# Patient Record
Sex: Male | Born: 1940 | Race: Black or African American | Hispanic: No | Marital: Married
Health system: Southern US, Community
[De-identification: ages and names within clinical notes are randomized; demographics above are authoritative.]

---

## 2004-04-14 ENCOUNTER — Ambulatory Visit (HOSPITAL_COMMUNITY): Admission: RE | Admit: 2004-04-14 | Discharge: 2004-04-14 | Payer: Self-pay | Admitting: Neurosurgery

## 2004-05-19 ENCOUNTER — Inpatient Hospital Stay (HOSPITAL_COMMUNITY): Admission: RE | Admit: 2004-05-19 | Discharge: 2004-05-24 | Payer: Self-pay | Admitting: Neurosurgery

## 2004-05-24 ENCOUNTER — Inpatient Hospital Stay (HOSPITAL_COMMUNITY)
Admission: RE | Admit: 2004-05-24 | Discharge: 2004-05-28 | Payer: Self-pay | Admitting: Physical Medicine & Rehabilitation

## 2004-12-15 IMAGING — CR IR MYELOGRAM [PERSON_NAME]
2 series · 2 of 2 positions shown · non-contrast
Comparison: none

CLINICAL DATA: eft leg pain; three prior surgeries
CERVICAL MYELOGRAM WITH POSTMYELOGRAM CT CERVICAL SPINE
Contrast was negotiated into the cervical spine without any difficulty.  Impression upon the lateral aspect (right greater than left) of the thecal sac at the C4-5 level near the exiting C5 nerve roots.  Please see below.  On the crosstable lateral view, there is evidence of prior compression deformity of the C7 vertebra with mild retropulsion.  
IMPRESSION
Encroachment upon the C5 nerve roots right greater than left.  lease see below.
Prior compression deformity C7 vertebra with mild retropulsion.  
POSTMYELOGRAM CT CERVICAL SPINE 
Axial images were obtained from the occiput to the T2-3 disc space level.  Sagittal and coronal reconstructed images were obtained.

[view not recorded (1 of 2)]
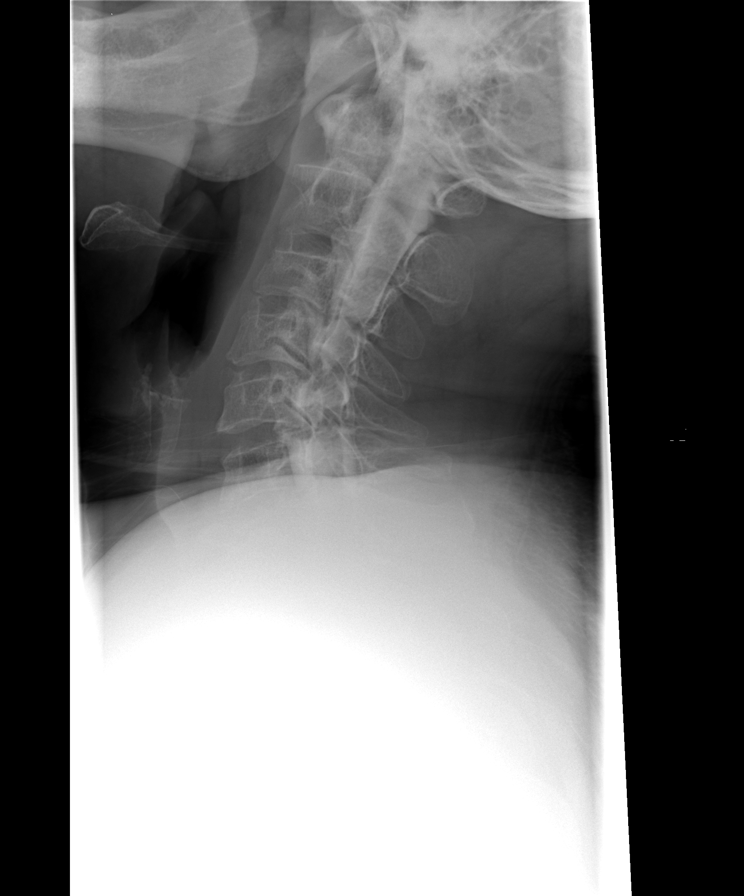

[view not recorded (2 of 2)]
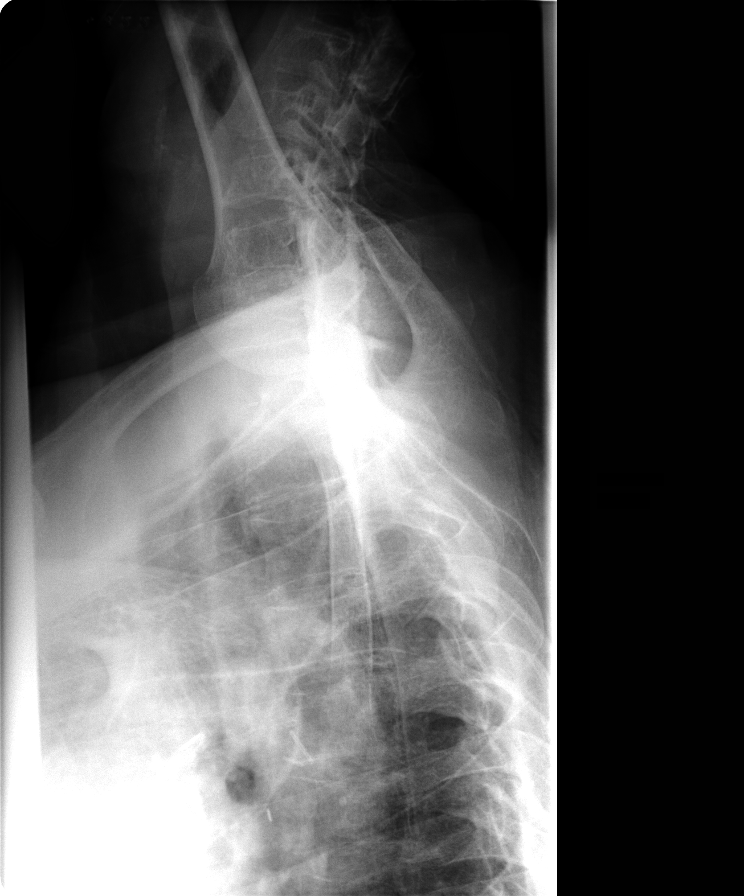

[2 of 2 positions shown; findings below may reference images not displayed]

FINDINGS: ervicomedullary junction is unremarkable.  C1-2 articulation within normal limits.  
C2-3:  Minimal right posterolateral bony spur.  
C3-4:  Small broad-based disc protrusion/spur having greatest extension right posterolateral position with slight impression upon the right ventral aspect of the thecal sac.
C4-5:  Moderate bulge/small broad-based protrusion minimally more notable towards right.  Moderate right-sided and mild-moderate left-sided spinal stenosis.  Uncovertebral joint degenerative changes with moderate right-sided and mild left-sided neural foraminal narrowing.  
C5-6:  Moderate bulge/small broad-based protrusion minimally more notable towards the left.  Mild spinal stenosis (left greater than right).  
C6-7:  Prior fracture of the C7 vertebra.  The C6-7 vertebra now appear fused.  There is retropulsion of the posterior aspect of the compressed C7 vertebra causing mild spinal stenosis but without significant cord deformity.
C7-T1:  Small broad-based central disc protrusion.
T1-2 and T2-3 with minimal bulge.
IMPRESSION
Prior compression deformity of the C7 vertebra with retropulsion with mild spinal stenosis but without cord deformity.    
C3-4   bony spur and mass effect right greater than left.  
C4-5 foraminal narrowing right greater than left.  Spinal stenosis slightly greater on the right.
C5-6 minimal disc disease slightly more notable on the left.  
CT CERVICAL SPINE MULTIPLANAR RECONSTRUCTIONS
Multiplanar reformatted CT images were reconstructed from the axial CT data set. These images were reviewed and pertinent findings are included in the accompanying complete CT report. 

IMPRESSION
See complete CT report.
LUMBAR MYELOGRAM FOLLOWED BY POSTMYELOGRAM CT
FINDINGS: Installation of contrast was performed by Dr. Luiyi at the L3-4 level.  Lumbar myelogram was performed without any difficulty.  Minimal impression upon the ventral aspect of  thecal sac L1-2 through L4-5.  L5-S1 disc is degenerated with disc space narrowing and osteophyte formation.  No specific nerve root compression.  Please see CT report.  Images of the conus reveal a prominent vessel.  If there are any conus symptoms, correlation with MR scan with attention to such recommended.  This was discussed with Dr. Luiyi.  
IMPRESSION
Significant degenerative changes L5-S1.  No abnormal motion between flexion and extension.  No specific nerve root compression.  Please see CT report.  
POSTMYELOGRAM CT 
Images were obtained from the mid-QHH through the upper S1 level.  Conus L2-3 level.  Incidentally noted are atherosclerotic type changes of the abdominal aorta and common iliac arteries which appear slightly ectatic but incompletely evaluated on present examination.  Also detected are right greater than left renal calculi.  
T12-L1:  Negative.  
L1-2:  Negative.  
L2-3:  Negative.
L3-4:  Mild facet joint degenerative changes.  Mild bulge.  
L4-5:  Moderate bilateral facet joint degenerative changes.  Mild to moderate bulge.  Bilateral foraminal narrowing without nerve root compression.  Mild to moderate spinal stenosis.  
L5-S1:  Prior left-sided hemilaminectomy.  Marked disc degeneration, disc space narrowing and adjacent endplate degenerative changes with broad-based osteophytes.  Bilateral facet joint degenerative disease and bony overgrowth.  Multifactorial moderate bilateral foraminal narrowing with encroachment upon the course of the exiting L5 nerve roots.  
IMPRESSION 
L5-S1 moderate bilateral foraminal narrowing with encroachment upon the course of the exiting L5 nerve roots.  
L4-5 multifactorial mild to moderate spinal stenosis and mild bilateral foraminal narrowing.
Atherosclerotic type changes of abdominal aorta and common iliac arteries as described above.
Bilateral renal calculi right greater than left.  
CT LUMBAR SPINE MULTIPLANAR RECONSTRUCTIONS
Multiplanar reformatted CT images were reconstructed from the axial CT data set. These images were reviewed and pertinent findings are included in the accompanying complete CT report. 

IMPRESSION
See complete CT report.

## 2008-03-17 ENCOUNTER — Ambulatory Visit (HOSPITAL_COMMUNITY): Admission: RE | Admit: 2008-03-17 | Discharge: 2008-03-17 | Payer: Self-pay | Admitting: Urology

## 2008-05-26 ENCOUNTER — Ambulatory Visit (HOSPITAL_COMMUNITY): Admission: RE | Admit: 2008-05-26 | Discharge: 2008-05-26 | Payer: Self-pay | Admitting: Urology

## 2011-04-29 NOTE — H&P (Signed)
NAME:  GIL, INGWERSEN NO.:  [id number]   MEDICAL RECORD NO.:  [id number]                   PATIENT TYPE:  INP   LOCATION:  3031                                 FACILITY:  [facility]   PHYSICIAN:  Hilda Lias, M.D.                DATE OF BIRTH:  1941-04-07   DATE OF ADMISSION:  05/19/2004  DATE OF DISCHARGE:                                HISTORY & PHYSICAL   Mr. Ripberger is a gentleman who was seen in my office on several occasions  because of a history of neck pain.  The patient had been complaining of pain  going from his neck down to his shoulder, going to the right arm, associated  with a numbness sensation.  Also he has been complaining of pain in the  right leg.  The patient tells me the pain in her lower extremities worsened  __________  .  Also he tells me that the numbness in both hands goes away  after he shakes both hands.  In the past, this gentleman had three lumbar  diskectomies because of radiculopathy.  Now, he tells me his neck is feeling  much better but the back pain with radiation to both legs is driving him  crazy.  Because of that, he wants to proceed with surgery.  In the past, he  has a fracture of C7.   PAST MEDICAL HISTORY:  1. Lumbar laminectomy and diskectomy three times in [place].  2. Fracture of cervical 7.   ALLERGIES:  TORADOL.   SOCIAL HISTORY:  The patient does not drink, does not smoke.   FAMILY HISTORY:  Unremarkable.   REVIEW OF SYSTEMS:  Positive for neck pain, numbness in both hands, back  pain with radiation down to both legs.   PHYSICAL EXAMINATION:  HEENT:  Normal.  NECK:  He is able to __________  some discomfort.  LUNGS:  Clear.  HEART:  Sounds normal.  ABDOMEN:  Normal.  EXTREMITIES:  Normal pulses.  NEURO:  Mental status:  Normal.  Cranial nerves:  Normal.  Strength:  Is  5/5.  He has some weakness on dorsiflexion and plantar flexion of the right  leg.  He has had difficulty with flexibility  of the lumbar spine.  His  sensory is normal, though he complains of numbness in right foot and both  hands.   The cervical MRI shows some degenerative disk disease at the level of C4-C5  and C5-C6.  He has a fracture at the level of C6-C7.  In the lumbar spine,  he has a severe case of degenerative disk disease of the L5-S1 with facet  arthropathy, foraminal stenosis.  He had a borderline stenosis at the level  of L4-L5.   CLINICAL IMPRESSION:  1. Status post three lumbar diskectomies laminectomy.  2. Fracture of C7.  3. Cervical spondylosis.  4. Severe case of degenerative disk disease at the level of L5-S1 with facet  arthropathy and borderline stenosis between L4-L5.   RECOMMENDATIONS:  The patient is being admitted for a L5-S1 diskectomy,  interbody fusion with pedicle screws.  He knows about the risks such as  infection, CSF leak, worsening pain, paralysis, need for further surgery,  collapse of the bone graft, damage to the vessels of the abdomen.  He knows  this surgery is not going to correct the problem with the cervical spine.  He declined more opinion.                                                Hilda Lias, M.D.    EB/MEDQ  D:  05/19/2004  T:  05/20/2004  Job:  [id number]

## 2011-04-29 NOTE — Consult Note (Signed)
NAME:  Samuel Hardin, Samuel Hardin NO.:  [id number]   MEDICAL RECORD NO.:  [id number]          PATIENT TYPE:  OUT   LOCATION:  RDC                           FACILITY:  [facility]   PHYSICIAN:  Ky Barban, M.D.DATE OF BIRTH:  1941-10-06   DATE OF CONSULTATION:  DATE OF DISCHARGE:  03/17/2008                                 CONSULTATION   CHIEF COMPLAINT:  Right ureteral calculus.   HISTORY:  A 70 year old gentleman who was seen by me with history of  right renal calculus on March 17, 2008.  CT scan shows there is a stone,  about 5 mm, in the right mid ureter causing some hydroureteronephrosis.  A KUB confirmed the stone, so I advised him to undergo ESL, for which he  is coming as outpatient.  The patient is familiar with the procedure  because he had it done in the past.   PAST MEDICAL HISTORY:  1. History of lithotripsies on both sides.  2. Surgery on his lower back,  a couple of years ago.   PERSONAL HISTORY:  Does not smoke or drink.   REVIEW OF SYSTEMS:  Denies chest pain, orthopnea, PND, nausea, or  vomiting.   PHYSICAL EXAMINATION:  GENERAL:  Well nourished and well developed, not  in acute distress.  VITAL SIGNS:  Blood pressure 124/75 and temperature 97.7.  CENTRAL NERVOUS SYSTEM:  Negative.  HEAD, NECK, EYES, AND ENT:  Negative.  ABDOMEN:  Soft and flat.  Liver, spleen, and kidney are not palpable.  EXTREMITIES:  No CVA tenderness.  GENITALIA:  External genitalia, circumcised meatus adequate.  Testicles  are normal.  RECTAL:  Prostate 1-1/2+, smooth and firm.   ASSESSMENT:  1. Bilateral renal small calculi.  2. Right ureteral calculus.   PLAN:  Right ureteral ESL.     Ky Barban, M.D.  Electronically Signed    MIJ/MEDQ  D:  03/31/2008  T:  04/01/2008  Job:  [id number]

## 2011-04-29 NOTE — Discharge Summary (Signed)
NAME:  Samuel Hardin, Samuel Hardin NO.:  [id number]   MEDICAL RECORD NO.:  [id number]                   PATIENT TYPE:  IPS   LOCATION:  4146                                 FACILITY:  [facility]   PHYSICIAN:  Ellwood Dense, M.D.                DATE OF BIRTH:  03/02/1941   DATE OF ADMISSION:  05/24/2004  DATE OF DISCHARGE:  05/28/2004                                 DISCHARGE SUMMARY   DISCHARGE DIAGNOSES:  1. L5-S1 diskectomy with decompression secondary to degenerative disk     disease of L5-S1.  2. Chronic radiculopathy.  3. Status post hypokalemia.  4. Muscle spasms and neuropathy.  5. Cervical spondylosis.   HISTORY OF PRESENT ILLNESS:  The patient is a 70 year old black male with  past history of a lumbar laminectomy and diskectomy x3 with progressive neck  pain radiating to the right shoulder, right arm, right leg, numbness and  back pain.  The patient underwent an L5-S1 diskectomy with decompression of  L5-S1 nerve root on May 19, 2004 by Dr. Tobey Grim secondary to DDD at L5-S1  and chronic radiculopathy.  The patient also was noted to have cervical  spondylosis.  No DVT prophylaxis at that time.  The patient was ambulating 6  feet with PT for safety on a walker, minimal assist and transfer total  assist +2.  Gait mobility total assist.  Postoperative complications - see  above, pain intolerance and spasms.  The patient was transferred to [facility] re[facility] department on May 24, 2004.   PAST MEDICAL HISTORY:  As above, plus surgical fractures.  Kidney stones.   PAST SURGICAL HISTORY:  As above.   MEDICATIONS PRIOR TO ADMISSION:  None.   SOCIAL HISTORY:  The patient lives with his wife in a 1-level home in  [place].  No tobacco or alcohol use.  He has 2 children.  Good family support.  His wife works for day care and  grandson is available to assist after discharge.   ALLERGIES:  ___________   FAMILY HISTORY:  Noncontributory.   HOSPITAL COURSE:   Mr. Samuel Hardin was admitted to [facility]  Department on May 24, 2004.  This patient received more than 3 hours of therapy daily.  Overall Mr.  Hardin progressed very well during his 4-day stay in rehab.  On  discharge he had a modified independent level.  His surgical incisions  healed very well. The patient was able to don his brace by himself without  any complications by time of discharge.  His pain will be controlled with  Robaxin as well as OxyContin and Oxycodone.  During admission the patient  was on ferrous sulfate for anemia.  This was discontinued due to a stable  hemoglobin.  The patient was started on potassium supplementation on May 25, 2004,  Kay-Dur 20 mEq p.o. b.i.d.  His potassium level was 3.1.  His latest  hemoglobin performed on  May 25, 2004 was 11.8, hematocrit was 35.4.   When his pain was controlled the patient progressed very well.  There were  no other major issues while the patient was in rehab.   His latest labs indicate his hemoglobin was 11.8, HCT 34.5, white blood cell  count 6500, platelet count 335,000.  Latest sodium 141, chloride 101, CO2  26, glucose 128, BUN 8, creatinine 0.9.  AST 69, ALT 43.  Urine culture  performed on 02/25/2003 was no growth x1 day.  Urinalysis remained clear.  Vital signs at time of discharge were stable.   Physical therapy report at time of discharge indicates that patient is able  to ambulate approximately  100 feet modified independently.  He is able to transfer from sit to stand,  modified independently, bed mobility modified independently, able to perform  most ADLs modified routinely.  The patient was discharged home with his  family.   DISCHARGE MEDICATIONS:  1. Neurontin 300 mg t.i.d.  2. Robaxin 5 mg b.i.d. p.r.n.  3. OxyContin to follow taper.  4. Percocet 1-2 tablets q.4-6h  p.r.n. for pain.   The patient is instructed not to exceed more than 4000 mg of Tylenol daily.  His pain management  includes OxyContin and Percocet with Tylenol.  He is to  observe back precautions.  Use back brace with out of bed.  No driving, no  smoking, use walker. [facility].  He needs to find a primary care Samuel Hardin.  Follow up with  Dr. Hilda Lias in 2 weeks.  Follow up with Dr. Thomasena Edis as needed.      Drucilla Schmidt, P.A.                         Ellwood Dense, M.D.    LB/MEDQ  D:  06/07/2004  T:  06/08/2004  Job:  [id number]   cc:   Hilda Lias, M.D.  [address]  [place], [place] [address]  Fax: [phone]

## 2011-04-29 NOTE — Discharge Summary (Signed)
NAME:  Samuel Hardin, Samuel Hardin NO.:  [id number]   MEDICAL RECORD NO.:  [id number]                   PATIENT TYPE:  INP   LOCATION:  3031                                 FACILITY:  [facility]   PHYSICIAN:  Clydene Fake, M.D.               DATE OF BIRTH:  1941/01/05   DATE OF ADMISSION:  05/19/2004  DATE OF DISCHARGE:  05/24/2004                                 DISCHARGE SUMMARY   DIAGNOSIS:  Degenerative disc disease, spondylosis L5-S1 with stenosis.   DISCHARGE DIAGNOSES:  Degenerative disc disease, spondylosis L5-S1 with  stenosis.   PROCEDURE:  L5-S1 posterior lumbar interbody fusion with interbody spacers  and pedicle screw fixation on the left side, posterolateral fusion.  Surgery  by Dr. Jeral Fruit.   REASON FOR ADMISSION:  The patient is a 70 year old gentleman who has had  low back pain radiating to the right leg, has been worsening lately.  He has  had prior lumbar surgeries.  MRI shows severe degeneration L5-S1, face  arthropathy, and foraminal stenosis.  The patient brought in for  decompression and fusion of lumbar spine.   HOSPITAL COURSE:  The patient was admitted the day of surgery, underwent the  procedure above.  He had pedicle screws placed only unilaterally.  Postoperatively the patient was doing well, was sent to recovery and then to  the floor.  By May 21, 2004 he was ambulating, no weakness.  He was walking  with some assistance but overall doing well and making progress.  Rehab was  consulted to assist with ambulation and increase activity.  He did have  weakness of the left dorsiflexion at 4/5 which remained stable.  A few days  of possible postoperative ileus but by May 24, 2004 did have a bowel  movement, was urinating well; incisions remained clean, dry, and intact.  He  was ambulating and motor strength did seem grossly intact with some improved  strength by that day, and sensation was intact to exam but the patient did  complain of  numbness in the left foot.  He continued to make progress and he  was accepted to rehab, discharged from the hospital, and transferred to  rehab on May 24, 2004 in stable condition.  Follow-up will be with Dr.  Jeral Fruit after discharge.                                                Clydene Fake, M.D.    JRH/MEDQ  D:  07/08/2004  T:  07/08/2004  Job:  [id number]

## 2011-04-29 NOTE — Op Note (Signed)
NAME:  Samuel Hardin, Samuel Hardin NO.:  [id number]   MEDICAL RECORD NO.:  [id number]                   PATIENT TYPE:  INP   LOCATION:  3031                                 FACILITY:  [facility]   PHYSICIAN:  Hilda Lias, M.D.                DATE OF BIRTH:  04-19-1941   DATE OF PROCEDURE:  05/19/2004  DATE OF DISCHARGE:                                 OPERATIVE REPORT   PREOPERATIVE DIAGNOSES:  1. Cervical spondylosis.  2. Degenerative disk disease at the level of L5-S1, with a chronic L5     radiculopathy, status post three lumbar diskectomies in [place],     bilateral L5 radiculopathy.   POSTOPERATIVE DIAGNOSES:  1. Cervical spondylosis.  2. Degenerative disk disease at the level of L5-S1, with a chronic L5     radiculopathy, status post three lumbar diskectomies in [place],     bilateral L5 radiculopathy.  3. Osteoporosis.   PROCEDURES:  1. Bilateral l5S1 diskectomy, lysis of adhesions with decompression of the     L5 and S1 nerve root, L5S1 diskectomy, interbody fusion with allograft     and autograft.  2. Pedicle screws in the left side at L5S1.  Failure to insert pedicle     screws in the right side.  3. L5-S1 posterolateral arthrodesis.  4. C-arm.  5. Bone marrow aspiration.   SURGEON:  Hilda Lias, M.D.   ASSISTANT:  Coletta Memos, M.D.   CLINICAL HISTORY:  Samuel Hardin is a gentleman who is 70 years old, who  had three lumbar procedures in [place].  He came to my office complaining  of neck pain and back pain.  He told me that the back pain is worse with  radiation down to both legs.  X-rays show a severe case of degenerative disk  disease at the level of L5-S1.  Surgery was advised, and the patient knew  about the risks.   PROCEDURE:  The patient was taken to the OR, and he was positioned in a  prone manner.  The back was prepped with Betadine.  An incision projecting  the previous one was made.  Muscle was retracted laterally.  We  identified  by x-ray the area of L5-S1.  There was a lot of scar tissue, mostly in the  left side.  With the Leksell we removed the spinous process of L5.  We did a  laminectomy of L5 to decompress the L5-S1 plus the L4-5 space.  In the left  side we found quite a bit of scar tissue.  Because of that we went ahead and  we went to the right side, where we removed the lower facet of L5.  We went  laterally and decompressed the L5 and S1 nerve root.  From then on we went  laterally and removed the facet on the opposite side.  The patient had a lot  of scar tissue.  Lysis was accomplished.  We entered in the disk space in  the right side first.  Total diskectomy was done.  We went into this spacer,  and from then on we went into the left side where the diskectomy was done on  the opposite side.  At the end after we removed the end plate, we were able  to introduce two pieces of bone graft of 8 x 24.  Autograft and allograft  using bone marrow aspiration was used as a mix not only into the disk space  but also laterally for arthrodesis at L5-S1 after we drilled the spinous  processes.  In the left side we introduced a pedicle probe with the help of  the C-arm.  This was followed by a tap and a screw of 6.5 x 45.  In the  right side we introduced the pedicle at the level of L5 without any problem.  We introduced the pedicle screw at the S1.  We tried to pull it to see how  secure was it placed.  It came out immediately.  Nevertheless, we were  within the pedicle itself and there was good bone surrounding the hole.  We  attempted to put another one, 7.5 x 45, but again the same thing happened  although we were into the pedicle itself.  There was no question that Mr.  Hardin, his bone was really soft.  Because of that we decided to leave  only the pedicle screw in the left side.  Arthrodesis, as I mentioned above,  was done bilaterally with more emphasis on the right side.  Then a rod  followed  by a cap was used on the left side.  The area was secured in place.  AP and lateral showed good position of the pedicle screws.  There was good  space for the L5 and S1 nerve roots bilaterally.  Hemostasis was done with  bipolar, and the wound was closed with Vicryl and a Steri-Strip.  Mr.  Hardin is going to have instead of the regular lumbar corset, he is  going to have a fiberglass corset with a right leg extension.  Also we are  going to find an external bone stimulator for him.                                               Hilda Lias, M.D.    EB/MEDQ  D:  05/19/2004  T:  05/20/2004  Job:  [id number]

## 2017-07-03 ENCOUNTER — Encounter (INDEPENDENT_AMBULATORY_CARE_PROVIDER_SITE_OTHER): Payer: Medicare HMO | Admitting: Ophthalmology

## 2017-07-03 DIAGNOSIS — H43813 Vitreous degeneration, bilateral: Secondary | ICD-10-CM

## 2017-07-03 DIAGNOSIS — H353131 Nonexudative age-related macular degeneration, bilateral, early dry stage: Secondary | ICD-10-CM

## 2017-07-03 DIAGNOSIS — H35373 Puckering of macula, bilateral: Secondary | ICD-10-CM | POA: Diagnosis not present

## 2017-12-11 ENCOUNTER — Encounter (INDEPENDENT_AMBULATORY_CARE_PROVIDER_SITE_OTHER): Payer: Medicare HMO | Admitting: Ophthalmology

## 2017-12-20 ENCOUNTER — Encounter (INDEPENDENT_AMBULATORY_CARE_PROVIDER_SITE_OTHER): Payer: Medicare HMO | Admitting: Ophthalmology

## 2018-11-20 ENCOUNTER — Encounter (INDEPENDENT_AMBULATORY_CARE_PROVIDER_SITE_OTHER): Payer: Managed Care, Other (non HMO) | Admitting: Ophthalmology

## 2018-11-20 DIAGNOSIS — H43813 Vitreous degeneration, bilateral: Secondary | ICD-10-CM | POA: Diagnosis not present

## 2018-11-20 DIAGNOSIS — H35373 Puckering of macula, bilateral: Secondary | ICD-10-CM | POA: Diagnosis not present

## 2018-12-21 ENCOUNTER — Encounter (INDEPENDENT_AMBULATORY_CARE_PROVIDER_SITE_OTHER): Payer: Managed Care, Other (non HMO) | Admitting: Ophthalmology

## 2018-12-21 DIAGNOSIS — H35373 Puckering of macula, bilateral: Secondary | ICD-10-CM

## 2018-12-21 DIAGNOSIS — H353111 Nonexudative age-related macular degeneration, right eye, early dry stage: Secondary | ICD-10-CM

## 2018-12-21 DIAGNOSIS — H43813 Vitreous degeneration, bilateral: Secondary | ICD-10-CM | POA: Diagnosis not present

## 2018-12-21 NOTE — H&P (Signed)
Samuel Hardin is an 78 y.o. male.   Chief Complaint:Painless loss of vision left eye HPI: Noted vision loss left eye for 2 months   No past medical history on file.    No family history on file. Social History:  has no history on file for tobacco, alcohol, and drug.  Allergies: Allergies not on file  No medications prior to admission.    Review of systems otherwise negative  There were no vitals taken for this visit.  Physical exam: Mental status: oriented x3. Eyes: See eye exam associated with this date of surgery in media tab.  Scanned in by scanning center Ears, Nose, Throat: within normal limits Neck: Within Normal limits General: within normal limits Chest: Within normal limits Breast: deferred Heart: Within normal limits Abdomen: Within normal limits GU: deferred Extremities: within normal limits Skin: within normal limits  Assessment/Plan Preretinal fibrosis left eye.  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

## 2019-01-04 NOTE — Progress Notes (Signed)
Pt stated that his surgery must be cancelled due to another medical diagnosis. Nurse to call MD's office to make aware.

## 2019-01-04 NOTE — Progress Notes (Signed)
Surgical Coordinator stated that she would make MD aware that pt is cancelling.

## 2019-01-08 ENCOUNTER — Ambulatory Visit (HOSPITAL_COMMUNITY)
Admission: RE | Admit: 2019-01-08 | Payer: Managed Care, Other (non HMO) | Source: Home / Self Care | Admitting: Ophthalmology

## 2019-01-08 SURGERY — 25 GAUGE PARS PLANA VITRECTOMY WITH 20 GAUGE MVR PORT FOR MACULAR HOLE
Anesthesia: General | Laterality: Left

## 2019-01-15 ENCOUNTER — Encounter (INDEPENDENT_AMBULATORY_CARE_PROVIDER_SITE_OTHER): Payer: Managed Care, Other (non HMO) | Admitting: Ophthalmology

## 2019-03-13 DEATH — deceased
